# Patient Record
Sex: Male | Born: 1995 | Race: Black or African American | Hispanic: No | Marital: Single
Health system: Midwestern US, Community
[De-identification: ages and names within clinical notes are randomized; demographics above are authoritative.]

## PROBLEM LIST (undated history)

## (undated) DIAGNOSIS — Z9889 Other specified postprocedural states: Secondary | ICD-10-CM

## (undated) DIAGNOSIS — M7650 Patellar tendinitis, unspecified knee: Secondary | ICD-10-CM

## (undated) DIAGNOSIS — M7652 Patellar tendinitis, left knee: Secondary | ICD-10-CM

---
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
## 2015-07-17 IMAGING — CR DG CHEST 2V
2 series · 2 of 2 positions shown · non-contrast
Comparison: May 21, 2012

CLINICAL DATA: Pain

EXAM:
CHEST  2 VIEW

[w chest pa]
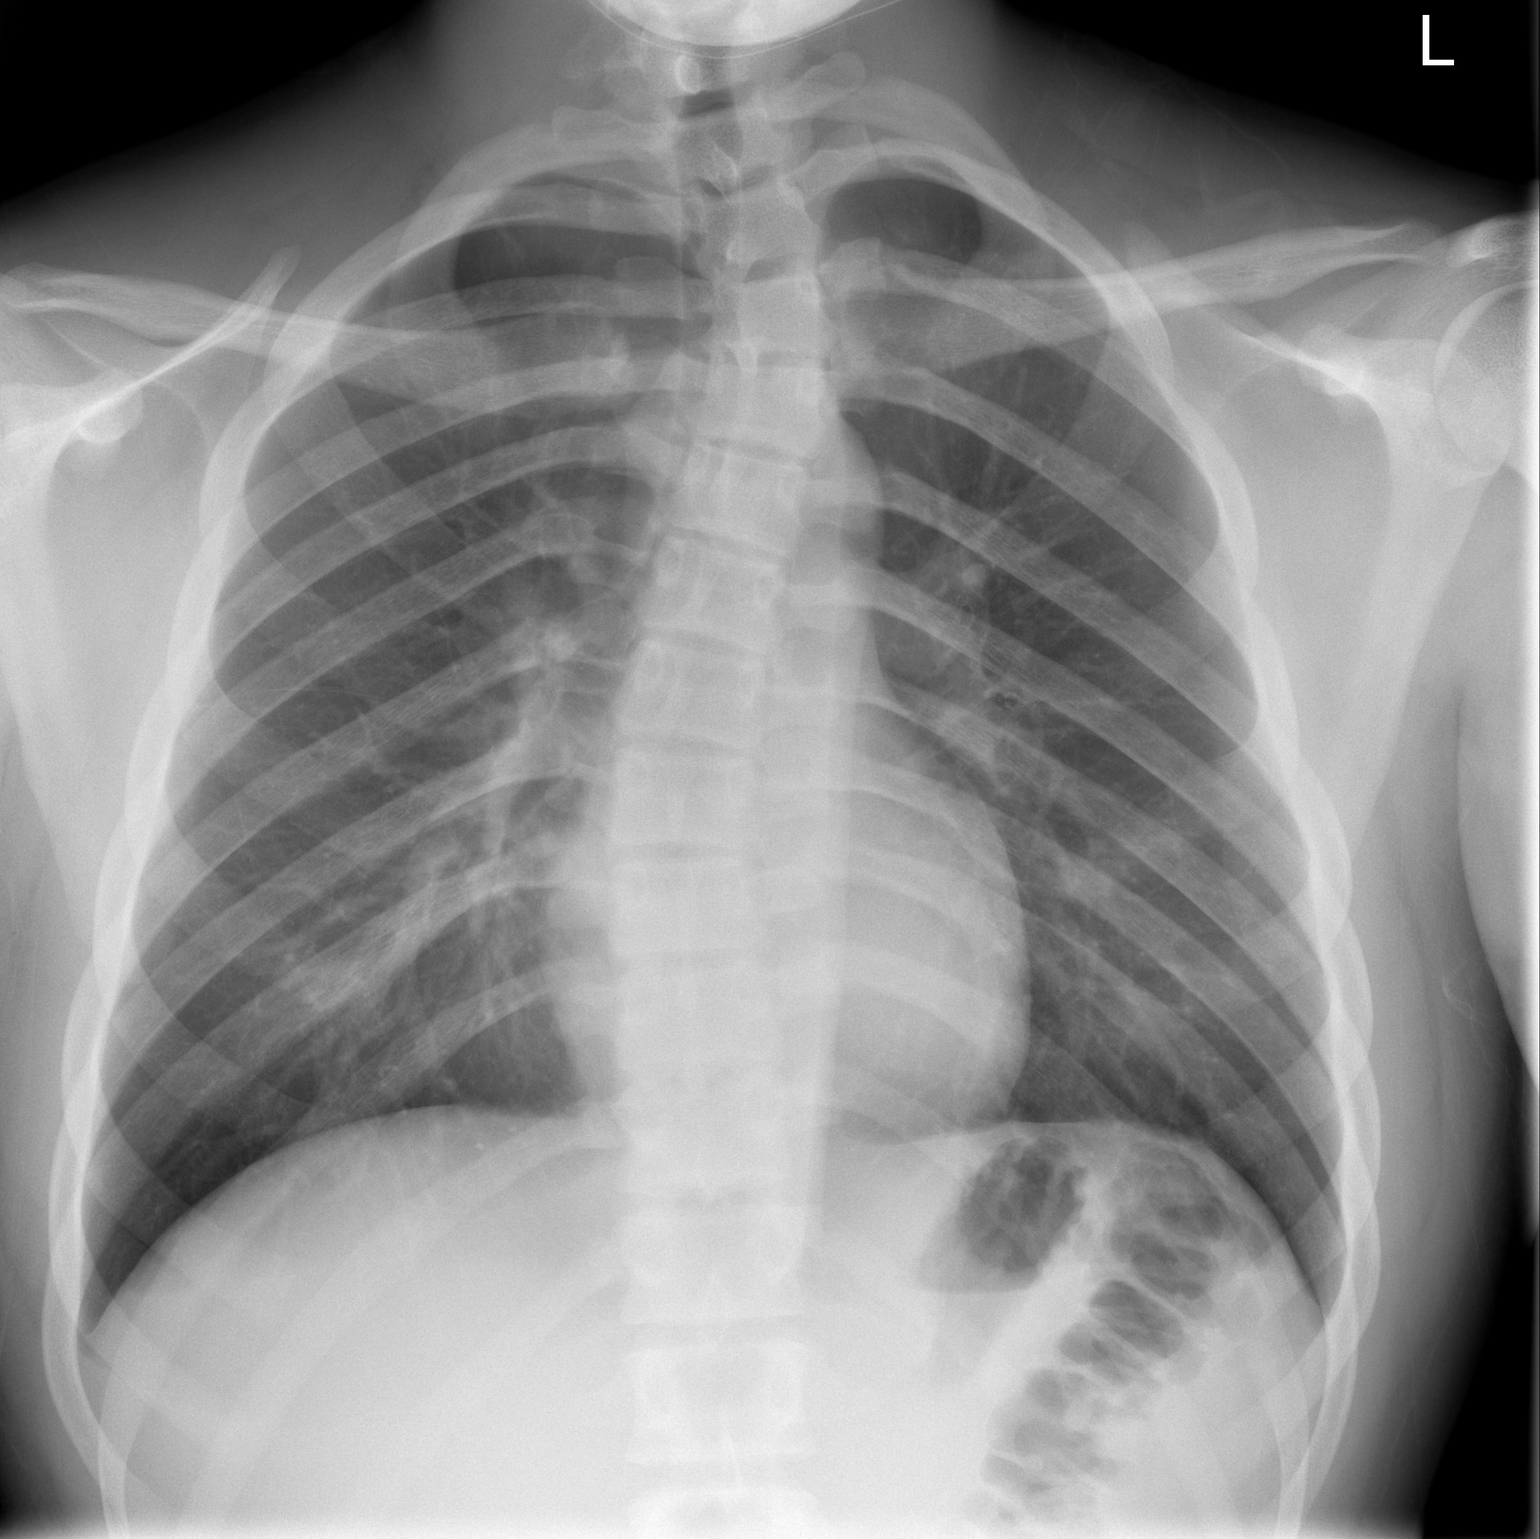

[w chest lat]
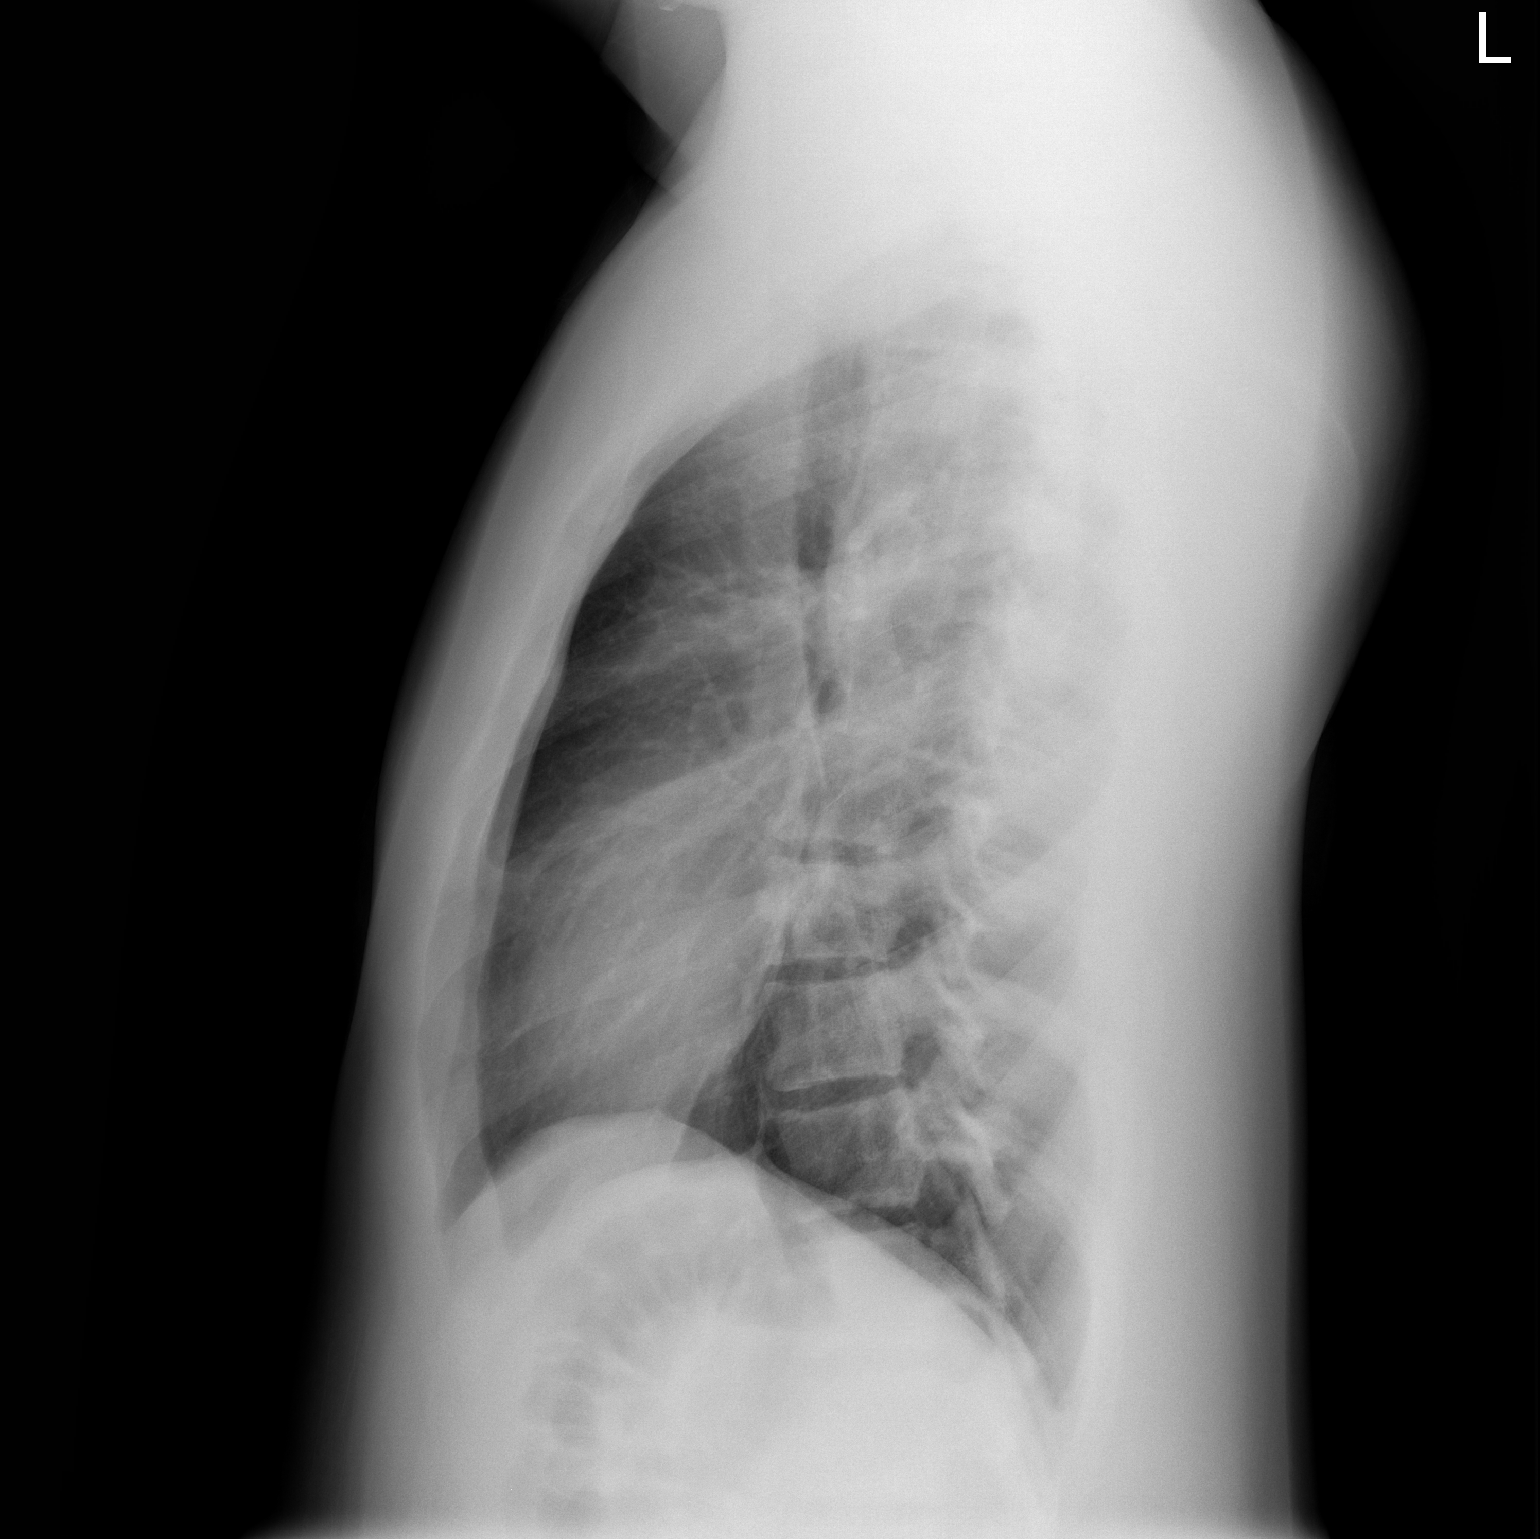

[2 of 2 positions shown; findings below may reference images not displayed]

FINDINGS: Lungs are clear. The heart size and pulmonary vascularity are
normal. No adenopathy. No pneumothorax. There is upper thoracic
levoscoliosis with mid thoracic dextroscoliosis. There is also
thoracic lordosis.
IMPRESSION: Scoliosis and thoracic lordosis.  Lungs clear.

## 2016-01-31 DIAGNOSIS — J069 Acute upper respiratory infection, unspecified: Secondary | ICD-10-CM

## 2016-01-31 NOTE — ED Provider Notes (Signed)
EMERGENCY DEPARTMENT HISTORY AND PHYSICAL EXAM    Date: 01/31/2016  Patient Name: Shawn Greer    History of Presenting Illness     Chief Complaint   Patient presents with   ??? Sinus Pain         History Provided By: Patient    Chief Complaint: sinus pain  Duration: 1 Months  Timing:  Waxing and Waning  Location: head  Associated Symptoms: nose bleed, head congestion, rhinorrhea, photophobia, HA, sore throat, cough    Additional History (Context):   11:48 PM    Shawn MontgomeryMichael L Fonseca is a 20 y.o. male who presents to the emergency department C/O sinus pain for 1 month. Associated sxs include right sided nose bleeds x4 today, head congestion, rhinorrhea, photophobia, HA waxing and waning, sore throat, and productive cough with green mucous. Pt has been using Nyquil. Today Pt was given Sudafed. Pt denies fever, tobacco use and any other sxs or complaints.     PCP: No primary care provider on file.        Past History     Past Medical History:  History reviewed. No pertinent past medical history.    Past Surgical History:  History reviewed. No pertinent surgical history.    Family History:  History reviewed. No pertinent family history.    Social History:  Social History   Substance Use Topics   ??? Smoking status: None   ??? Smokeless tobacco: None   ??? Alcohol use None       Allergies:  Allergies   Allergen Reactions   ??? Pcn [Penicillins] Hives         Review of Systems   Review of Systems   Constitutional: Negative for fever.   HENT: Positive for congestion, nosebleeds, rhinorrhea, sinus pain and sore throat.    Eyes: Positive for photophobia.   Respiratory: Positive for cough.    Neurological: Positive for headaches.   All other systems reviewed and are negative.      Physical Exam     Vitals:    01/31/16 2337   BP: 167/84   Pulse: 70   Resp: 16   Temp: 97.9 ??F (36.6 ??C)   SpO2: 100%   Weight: 124.7 kg (275 lb)   Height: 6\' 9"  (2.057 m)     Physical Exam    Constitutional: He is oriented to person, place, and time. He appears well-developed and well-nourished. No distress.   HENT:   Head: Normocephalic and atraumatic.   Right Ear: External ear normal. Tympanic membrane is not injected, not erythematous and not bulging. A middle ear effusion is present.   Left Ear: External ear normal. Tympanic membrane is not injected, not erythematous and not bulging. A middle ear effusion is present.   Nose: Right sinus exhibits no maxillary sinus tenderness and no frontal sinus tenderness. Left sinus exhibits no maxillary sinus tenderness and no frontal sinus tenderness.   Mouth/Throat: Uvula is midline, oropharynx is clear and moist and mucous membranes are normal. No oral lesions. No trismus in the jaw. No uvula swelling. No oropharyngeal exudate, posterior oropharyngeal edema, posterior oropharyngeal erythema or tonsillar abscesses.   Audible nasal congestion    Eyes: Conjunctivae and EOM are normal. Pupils are equal, round, and reactive to light.   Neck: Normal range of motion. Neck supple.   Cardiovascular: Normal rate and regular rhythm.    Pulmonary/Chest: Effort normal and breath sounds normal.   Musculoskeletal: Normal range of motion.   Neurological: He is alert  and oriented to person, place, and time.   Skin: Skin is warm and dry.   Psychiatric: He has a normal mood and affect. His behavior is normal.   Nursing note and vitals reviewed.        Diagnostic Study Results     Labs -   No results found for this or any previous visit (from the past 12 hour(s)).    Radiologic Studies -   No orders to display     CT Results  (Last 48 hours)    None        CXR Results  (Last 48 hours)    None          Medications given in the ED-  Medications - No data to display      Medical Decision Making   I am the first provider for this patient.    I reviewed the vital signs, available nursing notes, past medical history, past surgical history, family history and social history.     Vital Signs-Reviewed the patient's vital signs.    Pulse Oximetry Analysis - 100% on RA     Records Reviewed: Nursing Notes    Procedures:  Procedures    ED Course:   11:48 PM Initial assessment performed. The patients presenting problems have been discussed, and they are in agreement with the care plan formulated and outlined with them.  I have encouraged them to ask questions as they arise throughout their visit.    Diagnosis and Disposition       DISCHARGE NOTE:  11:56 PM  Marolyn Hammock Pew's  results have been reviewed with him.  He has been counseled regarding his diagnosis, treatment, and plan.  He verbally conveys understanding and agreement of the signs, symptoms, diagnosis, treatment and prognosis and additionally agrees to follow up as discussed.  He also agrees with the care-plan and conveys that all of his questions have been answered.  I have also provided discharge instructions for him that include: educational information regarding their diagnosis and treatment, and list of reasons why they would want to return to the ED prior to their follow-up appointment, should his condition change. He has been provided with education for proper emergency department utilization.     CLINICAL IMPRESSION:    1. Acute URI        PLAN:  1. D/C Home  2.   Current Discharge Medication List      START taking these medications    Details   butalbital-acetaminophen-caff (FIORICET) 50-300-40 mg per capsule Take 1 Cap by mouth every four (4) hours as needed for Pain. Max Daily Amount: 6 Caps.  Qty: 20 Cap, Refills: 0      loratadine-pseudoephedrine (CLARITIN-D 24 HOUR) 10-240 mg per tablet Take 1 Tab by mouth daily.  Qty: 12 Tab, Refills: 0      fluticasone (FLONASE) 50 mcg/actuation nasal spray 2 Sprays by Both Nostrils route daily.  Qty: 1 Bottle, Refills: 0           3.   Follow-up Information     Follow up With Details Comments Contact Info    [facility] Schedule an appointment as soon as possible for a visit For  primary care follow up [address] [place] [address]  [place] [address]  [phone]    [facility] EMERGENCY DEPT Go to As needed, If symptoms worsen [address]  [place] [address]  [phone]  _______________________________    Attestations:  This note is prepared by Sherrilee Gillesebecca A Hill, acting as Neurosurgeoncribe for [employer] FinancialBrannon Kacy Hegna, PA-C.    Lanecia Sliva, PA-C:  The scribe's documentation has been prepared under my direction and personally reviewed by me in its entirety.  I confirm that the note above accurately reflects all work, treatment, procedures, and medical decision making performed by me.  _______________________________

## 2016-01-31 NOTE — ED Triage Notes (Signed)
Sinus pain and pressure with cough and intermittent nose bleeds for a month.

## 2016-02-01 ENCOUNTER — Inpatient Hospital Stay
Admit: 2016-02-01 | Discharge: 2016-02-01 | Disposition: A | Discharge status: Home / Self Care | Payer: Self-pay | Attending: Internal Medicine

## 2016-02-01 MED ORDER — FLUTICASONE 50 MCG/ACTUATION NASAL SPRAY, SUSP
50 mcg/actuation | Freq: Every day | NASAL | 0 refills | Status: AC
Start: 2016-02-01 — End: ?

## 2016-02-01 MED ORDER — BUTALBITAL-ACETAMINOPHEN-CAFFEINE 50 MG-300 MG-40 MG CAPSULE
50-300-40 mg | ORAL_CAPSULE | ORAL | 0 refills | Status: AC | PRN
Start: 2016-02-01 — End: ?

## 2016-02-01 MED ORDER — LORATADINE-PSEUDOEPHEDRINE SR 10 MG-240 MG 24 HR TAB
10-240 mg | ORAL_TABLET | Freq: Every day | ORAL | 0 refills | Status: AC
Start: 2016-02-01 — End: ?

## 2021-08-31 ENCOUNTER — Inpatient Hospital Stay: Admit: 2021-08-31 | Payer: PRIVATE HEALTH INSURANCE

## 2021-08-31 ENCOUNTER — Encounter

## 2021-08-31 DIAGNOSIS — M7652 Patellar tendinitis, left knee: Secondary | ICD-10-CM

## 2021-08-31 LAB — CBC
Hematocrit: 45.1 % (ref 36.0–48.0)
Hemoglobin: 15.1 g/dL (ref 13.0–16.0)
MCH: 29.2 PG (ref 24.0–34.0)
MCHC: 33.5 g/dL (ref 31.0–37.0)
MCV: 87.1 FL (ref 78.0–100.0)
MPV: 9.2 FL (ref 9.2–11.8)
Nucleated RBCs: 0 PER 100 WBC
Platelets: 341 10*3/uL (ref 135–420)
RBC: 5.18 M/uL (ref 4.35–5.65)
RDW: 13.7 % (ref 11.6–14.5)
WBC: 8.4 10*3/uL (ref 4.6–13.2)
nRBC: 0 10*3/uL (ref 0.00–0.01)

## 2021-08-31 LAB — URINALYSIS
Bilirubin Urine: NEGATIVE
Blood, Urine: NEGATIVE
Glucose, UA: NEGATIVE mg/dL
Leukocyte Esterase, Urine: NEGATIVE
Nitrite, Urine: NEGATIVE
Protein, UA: NEGATIVE mg/dL
Specific Gravity, UA: 1.029 (ref 1.005–1.030)
Urobilinogen, Urine: 1 EU/dL (ref 0.2–1.0)
pH, Urine: 6 (ref 5.0–8.0)

## 2021-08-31 LAB — COMPREHENSIVE METABOLIC PANEL
ALT: 93 U/L — ABNORMAL HIGH (ref 16–61)
AST: 25 U/L (ref 10–38)
Albumin/Globulin Ratio: 1.2 (ref 0.8–1.7)
Albumin: 4.2 g/dL (ref 3.4–5.0)
Alk Phosphatase: 119 U/L — ABNORMAL HIGH (ref 45–117)
Anion Gap: 4 mmol/L (ref 3.0–18)
BUN: 19 MG/DL — ABNORMAL HIGH (ref 7.0–18)
Bun/Cre Ratio: 15 (ref 12–20)
CO2: 28 mmol/L (ref 21–32)
Calcium: 9.6 MG/DL (ref 8.5–10.1)
Chloride: 105 mmol/L (ref 100–111)
Creatinine: 1.25 MG/DL (ref 0.6–1.3)
Est, Glom Filt Rate: >60 mL/min/{1.73_m2} (ref >60)
Globulin: 3.5 g/dL (ref 2.0–4.0)
Glucose: 109 mg/dL — ABNORMAL HIGH (ref 74–99)
Potassium: 4.8 mmol/L (ref 3.5–5.5)
Sodium: 137 mmol/L (ref 136–145)
Total Bilirubin: 0.7 MG/DL (ref 0.2–1.0)
Total Protein: 7.7 g/dL (ref 6.4–8.2)

## 2021-09-01 LAB — EKG 12-LEAD
Atrial Rate: 91 {beats}/min
Diagnosis: NORMAL
P Axis: 31 degrees
P-R Interval: 136 ms
Q-T Interval: 342 ms
QRS Duration: 84 ms
QTc Calculation (Bazett): 420 ms
R Axis: 69 degrees
T Axis: 23 degrees
Ventricular Rate: 91 {beats}/min

## 2021-09-01 LAB — CULTURE, URINE: Culture: NO GROWTH

## 2021-09-02 ENCOUNTER — Inpatient Hospital Stay: Payer: PRIVATE HEALTH INSURANCE

## 2021-09-02 NOTE — Other (Signed)
PAT - SURGICAL PRE-ADMISSION INSTRUCTIONS    NAME:  Shawn Greer                                                          TODAY'S DATE:  09/02/2021    SURGERY DATE: 09/14/2021                               SURGERY ARRIVAL TIME:   tbd    Do NOT eat or drink anything, including candy or gum, after MIDNIGHT on 09/14/2021 , unless you have specific instructions from your Surgeon or Anesthesia Provider to do so.  No smoking 24 hours before surgery.  No alcohol 24 hours prior to the day of surgery.  No recreational drugs for one week prior to the day of surgery.  Leave all valuables, including money/purse, at home.  Remove all jewelry, nail polish, makeup (including mascara); no lotions, powders, deodorant, or perfume/cologne/after shave.  Glasses/Contact lenses and Dentures may be worn to the hospital.  They will be removed prior to surgery.  Call your doctor if symptoms of a cold or illness develop within 24 ours prior to surgery.  AN ADULT MUST DRIVE YOU HOME AFTER OUTPATIENT SURGERY.   If you are having an OUTPATIENT procedure, please make arrangements for a responsible adult to be with you for 24 hours after your surgery.  If you are admitted to the hospital, you will be assigned to a bed after surgery is complete.  Normally a family member will not be able to see you until you are in your assigned bed.  12. Visitation Restrictions Explained.    Special Instructions:    Bring ID and insurance card DOS  Stop supplements now prior to surgery  Follow all written and verbal instructions from your surgeon's office.  Patient Prep:    use CHG solution, Instructions provided to start washes 3 days prior to surgery.     These surgical instructions were reviewed with patient during the PAT phone (visit/phone call).  Pt verbalized understanding of instructions provided.

## 2021-09-12 NOTE — H&P (Signed)
Patient Name:  Shawn Greer, Shawn Greer    Date of Birth:  [birth year]      Chief Complaint:  Left knee pain.    History of Chief Complaint:  Shawn Greer comes in for evaluation of focal symptoms in the anterior left knee with limited motion and weightbearing pain.  He has pain with activity.  He is currently in an internship working with the [employer]  He had a pop and pain in the anterior portion of the knee about a week ago with continued limitations prompting today's evaluation.  MRI was done and points to a patellar tendon rupture prompting today's evaluation with the knee in extension.    Past Medical/Surgical History:  Patient reported no relevant past med/surg/psych history.     Allergies:    Ingredient Reaction Medication Name Comment   PENICILLINS Hives         Current Medications:  None.     Social History:    SMOKING  Status Tobacco Type Units Per Day Yrs Used   Never smoker        ALCOHOL  There is a history of alcohol use.     Family History:    Disease Detail Family Member Age Cause of Death Comments   Hypertension Other      Stroke Other      Diabetes mellitus Other      Rheumatoid arthritis Mother      Heart disease Other        Review of Systems:    GENERAL:  Patient has no signs of chills or weight change., fever or fatigue  HEAD/ENTM:  Patient has no signs of headaches, dizziness, hearing loss, sore throat, recent cold, double vision, blurred vision, itchy eyes, eye redness or eye discharge. or ringing in ears  NEUROLOGIC: Patient presents with numbness/tingling. Patient has no signs of muscle weakness, loss of balance or seizure disorder. or fainting  CARDIOVASCULAR:  Patient has no signs of chest pain, palpitations, rheumatic fever or heart murmur.  RESPIRATORY:  Patient has no signs of chronic cough, wheezing, pain on breathing or shortness of breath. or difficulty breathing  GASTROINTESTINAL:  Patient has no signs of nausea/vomiting, difficulty swallowing, gas/bloating,  indigestion, abdominal pain, diarrhea, bloody stools or hemorrhoids.  GENITOURINARY:  Patient has no signs of blood in the urine, burning sensation, frequent urinating or incontinence., painful urinating or bladder/kidney infection  MUSCULOSKELETAL: Patient presents with tendonitis, joint stiffness and joint pain. Patient has no signs of fracture/dislocation, sprain/strain, rheumatoid disease, gout or swelling in feet.  INTEGUMENTARY:  Patient has no signs of rash/itching, psoriasis, Raynaud's phenomenon or varicose veins.  EMOTIONAL:  Patient has no signs of bipolar disorder or change in mood., anxiety, depression or memory loss    Vitals:  Date BP Pulse Temp (F) Resp. (per min.) Height (Total in.) Weight (lbs.) BMI   08/30/2021     80.00 325.00 35.70   08/30/2021     80.00 325.00 35.70     Physical Examination:    Psychiatric/General:  No acute distress; pleasant and accommodating.  HEENT:  Moist mucous membranes intact.  Lymphatic:  Neck is supple with no lymphadenopathy upon evaluation.  Respiratory:   Equal bilateral chest wall movement with inspiration; no wheezes or stridor audible.  Cardiovascular:    Regular rate and rhythm, as noted by upper extremity pulses.  Musculoskeletal: On evaluation of the left lower extremity, brace is in place.  Motor is intact.  He has difficulty with knee extension and is utilization  of the brace makes it feel more stable.     MRI shows complete disruption of the inferior pole of the patella consistent with patellar tendon rupture.      Assessment: Left patellar tendon rupture. This injury is concerning especially in someone his size.  He is 6 foot 8, and his height is a contributing factor for this.  Failure of repair is a huge concern with these injuries.    Recommendation:  Patient was given an order for a hinged knee brace to be taken to another facility.  The patient is ambulatory but has weakness or deformity of their left knee which requires stabilization from this  semi-rigid/rigid orthosis to improve their function.    We reviewed findings and recommendations for moving forward. Surgical intervention is his best option with direct tendon repair.  He will continue efforts to maintain stability with a hinged knee brace to maintain zero which we will be able to transition to motion as he progresses with activities. We will continue plans for scheduling for patellar tendon repair.          Aleene Davidson, DO

## 2021-09-13 NOTE — Discharge Instructions (Addendum)
[facility]  Dr. Hurley Cisco Post-Operative Instructions Patella Tendon Repair    Diet:  1. Begin with liquids and light foods such as Jell-O and soups.  2. Advance as tolerated to your regular diet if not nauseated.    First 24 hours:  1. Be in the care of a responsible adult.  2. Do not drive or operate machinery.  3. Do not drink alcoholic beverages.    Activities:  1. Elevate the limb above hip and preferably above chest for 48 hours.  2. Ice should be applied to the knee in a waterproof bag for 15-30 minutes each hour while awake for first 48 hours.  3. You may walk with knee immobilizer on operative leg.  4. Do not engage in activities that increase your pain such as stair-climbing or prolonged standing.  5. Return to work depends on your type of employment.  6. Follow-up with Dr. Thomos Lemons in 7-10 days post-operatively.    Exercise:  1. Begin exercises the day of surgery for both legs and repeat hourly while awake:  *Quad sets (tightening the thigh muscles)  *?Straight leg raises (lift and hold 12-18" off bed or floor for 8 count)  *?Vigorous ankle pumps (toes towards and away from head)      Wound Care:  1. Maintain your postoperative dressing. Loosen the ACE wrap if swelling of the foot or ankle occurs.  2. You may remove your ACE wrap and gauze on the second post op day. Keep steri strips or bandage in place until first postop visit. You can re-wrap the ACE bandage until swelling of knee is gone. To maintain good circulation, do not wrap too tightly.  3. Keep the surgical incisions dry until you see your doctor. Use a plastic bag with rubber bands to cover the limb during showers. Immersing the limb in water is to be avoided.    Medications:  1. Strong oral narcotic pain medications have been prescribed for the first few days. Use only as directed. No pain medication is capable of taking away all the pain. Taking your pills at regular intervals will give you the best chance of having less pain.  2. If you need a refill  PLEASE PLAN AHEAD. Call our office during regular hours (8-5).  3. Do not combine with alcoholic beverages.  4. Be careful as you walk, climb stairs or drive as mild dizziness is not unusual.  5. Do not take medications that have not been prescribed by your surgeon.  6. You may switch to over the counter pain medication of your choice as you become more comfortable.    WHEN TO CALL YOUR SURGEON:  1. Significant swelling or any new numbness in the limb that was operated on  2. Unrelenting pain  3. Fever or Chills  4. Redness around incisions  5. Color change in foot or toes  6. Continuous drainage or bleeding from wounds (a small amount of drainage is expected)  7. Any other worrisome condition    WHEN TO CALL YOUR REGULAR DOCTOR:  1. Flare up of any of your regular medical conditions    WHEN TO CALL 911:  1. Chest Pain  2. Shortness of Breath  3. Any other acute serious condition    CALL THE OFFICE:  If you have severe pain unrelieved by the medications;  If you have a fever of 101.33F or greater;   If you notice excessive swelling, redness, or persistent drainage from the incision or IV site;  [facility] office number is [phone] from 8:00am to 5:00pm Monday through Friday. After 5:00pm, on weekends, or holidays, please leave a message with our answering service and the doctor on-call will get back to you shortly.    Marland Kitchen   DISCHARGE SUMMARY from Nurse    PATIENT INSTRUCTIONS:    After general anesthesia or intravenous sedation, for 24 hours or while taking prescription Narcotics:  Limit your activities  Do not drive and operate hazardous machinery  Do not make important personal or business decisions  Do  not drink alcoholic beverages  If you have not urinated within 8 hours after discharge, please contact your surgeon on call.    Report the following to your surgeon:  Excessive pain, swelling, redness or odor of or around the surgical area  Temperature over 100.5  Nausea and vomiting  lasting longer than 4 hours or if unable to take medications  Any signs of decreased circulation or nerve impairment to extremity: change in color, persistent  numbness, tingling, coldness or increase pain  Any questions    What to do at Home:  Recommended activity: , follow surgeons instructions     If you experience any of the following symptoms, fever, chills, foul drainage or uncontrolled pain please follow up with Dr. Thomos Lemons.    *  Please give a list of your current medications to your Primary Care Provider.    *  Please update this list whenever your medications are discontinued, doses are      changed, or new medications (including over-the-counter products) are added.    *  Please carry medication information at all times in case of emergency situations.    These are general instructions for a healthy lifestyle:    No smoking/ No tobacco products/ Avoid exposure to second hand smoke  Surgeon General's Warning:  Quitting smoking now greatly reduces serious risk to your health.    Obesity, smoking, and sedentary lifestyle greatly increases your risk for illness    A healthy diet, regular physical exercise & weight monitoring are important for maintaining a healthy lifestyle    You may be retaining fluid if you have a history of heart failure or if you experience any of the following symptoms:  Weight gain of 3 pounds or more overnight or 5 pounds in a week, increased swelling in our hands or feet or shortness of breath while lying flat in bed.  Please call your doctor as soon as you notice any of these symptoms; do not wait until your next office visit.    Patient armband removed and shredded     The discharge information has been reviewed with the patient and caregiver.  The patient and caregiver verbalized understanding.  Discharge medications reviewed with the patient and caregiver and appropriate educational materials and side effects teaching were  provided.  ___________________________________________________________________________________________________________________________________

## 2021-09-14 ENCOUNTER — Ambulatory Visit: Admit: 2021-09-14 | Payer: PRIVATE HEALTH INSURANCE

## 2021-09-14 ENCOUNTER — Inpatient Hospital Stay: Payer: 59 | Attending: Orthopaedic Surgery

## 2021-09-14 MED ORDER — ROPIVACAINE HCL 5 MG/ML IJ SOLN
INTRAMUSCULAR | Status: AC
Start: 2021-09-14 — End: 2021-09-14
  Administered 2021-09-14: 15:00:00 30 via PERINEURAL

## 2021-09-14 MED ORDER — FENTANYL CITRATE (PF) 100 MCG/2ML IJ SOLN
100 MCG/2ML | INTRAMUSCULAR | Status: AC
Start: 2021-09-14 — End: ?

## 2021-09-14 MED ORDER — DEXAMETHASONE SODIUM PHOSPHATE 4 MG/ML IJ SOLN
4 MG/ML | INTRAMUSCULAR | Status: DC | PRN
Start: 2021-09-14 — End: 2021-09-14
  Administered 2021-09-14: 17:00:00 8 via INTRAVENOUS

## 2021-09-14 MED ORDER — IPRATROPIUM-ALBUTEROL 0.5-2.5 (3) MG/3ML IN SOLN
Freq: Once | RESPIRATORY_TRACT | Status: DC | PRN
Start: 2021-09-14 — End: 2021-09-14

## 2021-09-14 MED ORDER — FENTANYL CITRATE (PF) 100 MCG/2ML IJ SOLN
100 MCG/2ML | INTRAMUSCULAR | Status: DC
Start: 2021-09-14 — End: 2021-09-14

## 2021-09-14 MED ORDER — LACTATED RINGERS IV SOLN
INTRAVENOUS | Status: DC
Start: 2021-09-14 — End: 2021-09-14
  Administered 2021-09-14: 19:00:00 125 via INTRAVENOUS
  Administered 2021-09-14 (×2): via INTRAVENOUS

## 2021-09-14 MED ORDER — DEXMEDETOMIDINE HCL 200 MCG/2ML IV SOLN
200 MCG/2ML | INTRAVENOUS | Status: AC
Start: 2021-09-14 — End: 2021-09-14
  Administered 2021-09-14: 15:00:00 .2 via PERINEURAL

## 2021-09-14 MED ORDER — MIDAZOLAM HCL 2 MG/2ML IJ SOLN
2 MG/ML | INTRAMUSCULAR | Status: AC
Start: 2021-09-14 — End: ?

## 2021-09-14 MED ORDER — GLYCOPYRROLATE 0.4 MG/2ML IJ SOLN
0.4 MG/2ML | INTRAMUSCULAR | Status: AC
Start: 2021-09-14 — End: ?

## 2021-09-14 MED ORDER — BUPIVACAINE-EPINEPHRINE 0.5% -1:200000 IJ SOLN (MIXTURES ONLY)
INTRAMUSCULAR | Status: DC | PRN
Start: 2021-09-14 — End: 2021-09-14
  Administered 2021-09-14: 18:00:00 100 via INTRAMUSCULAR

## 2021-09-14 MED ORDER — METOCLOPRAMIDE HCL 5 MG/ML IJ SOLN
5 MG/ML | Freq: Once | INTRAMUSCULAR | Status: DC | PRN
Start: 2021-09-14 — End: 2021-09-14

## 2021-09-14 MED ORDER — PROPOFOL 200 MG/20ML IV EMUL
200 MG/20ML | INTRAVENOUS | Status: DC | PRN
Start: 2021-09-14 — End: 2021-09-14
  Administered 2021-09-14 (×2): 20 via INTRAVENOUS
  Administered 2021-09-14: 17:00:00 300 via INTRAVENOUS

## 2021-09-14 MED ORDER — BUPIVACAINE LIPOSOME 1.3 % IJ SUSP
1.3 % | INTRAMUSCULAR | Status: AC
Start: 2021-09-14 — End: ?

## 2021-09-14 MED ORDER — GLYCOPYRROLATE 0.4 MG/2ML IJ SOLN
0.4 MG/2ML | INTRAMUSCULAR | Status: DC | PRN
Start: 2021-09-14 — End: 2021-09-14
  Administered 2021-09-14: 17:00:00 .2 via INTRAVENOUS

## 2021-09-14 MED ORDER — LIDOCAINE HCL (PF) 2 % IJ SOLN
2 % | INTRAMUSCULAR | Status: DC | PRN
Start: 2021-09-14 — End: 2021-09-14
  Administered 2021-09-14: 17:00:00 100 via INTRAVENOUS

## 2021-09-14 MED ORDER — HYDROMORPHONE HCL PF 1 MG/ML IJ SOLN
1 MG/ML | INTRAMUSCULAR | Status: DC | PRN
Start: 2021-09-14 — End: 2021-09-14

## 2021-09-14 MED ORDER — MIDAZOLAM HCL 2 MG/2ML IJ SOLN
2 MG/ML | INTRAMUSCULAR | Status: DC | PRN
Start: 2021-09-14 — End: 2021-09-14
  Administered 2021-09-14 (×2): 2 via INTRAVENOUS

## 2021-09-14 MED ORDER — MELOXICAM 15 MG PO TABS
15 MG | ORAL_TABLET | Freq: Every day | ORAL | 1 refills | Status: AC
Start: 2021-09-14 — End: 2021-11-13

## 2021-09-14 MED ORDER — PROPOFOL 200 MG/20ML IV EMUL
200 MG/20ML | INTRAVENOUS | Status: AC
Start: 2021-09-14 — End: ?

## 2021-09-14 MED ORDER — NORMAL SALINE FLUSH 0.9 % IV SOLN
0.9 % | INTRAVENOUS | Status: DC | PRN
Start: 2021-09-14 — End: 2021-09-14

## 2021-09-14 MED ORDER — DEXAMETHASONE SOD PHOSPHATE PF 10 MG/ML IJ SOLN
10 MG/ML | INTRAMUSCULAR | Status: AC
Start: 2021-09-14 — End: 2021-09-14
  Administered 2021-09-14: 15:00:00 4

## 2021-09-14 MED ORDER — EPHEDRINE SULFATE-NACL 50-0.9 MG/5ML-% IV SOSY
INTRAVENOUS | Status: DC | PRN
Start: 2021-09-14 — End: 2021-09-14
  Administered 2021-09-14 (×2): 10 via INTRAVENOUS

## 2021-09-14 MED ORDER — DEXAMETHASONE SODIUM PHOSPHATE 4 MG/ML IJ SOLN
4 MG/ML | INTRAMUSCULAR | Status: AC
Start: 2021-09-14 — End: ?

## 2021-09-14 MED ORDER — POVIDONE-IODINE 5 % OP SOLN
5 % | OPHTHALMIC | Status: DC | PRN
Start: 2021-09-14 — End: 2021-09-14
  Administered 2021-09-14: 17:00:00 30 via TOPICAL

## 2021-09-14 MED ORDER — CEPHALEXIN 500 MG PO CAPS
500 MG | ORAL_CAPSULE | Freq: Three times a day (TID) | ORAL | 0 refills | Status: AC
Start: 2021-09-14 — End: 2021-09-15

## 2021-09-14 MED ORDER — SODIUM CHLORIDE 0.9 % IR SOLN
0.9 % | Status: AC | PRN
Start: 2021-09-14 — End: 2021-09-14
  Administered 2021-09-14: 17:00:00 500

## 2021-09-14 MED ORDER — CEFAZOLIN 3000 MG IN 30 ML SWFI IV SYRINGE (PREMIX)
Freq: Three times a day (TID) | Status: DC
Start: 2021-09-14 — End: 2021-09-14
  Administered 2021-09-14: 17:00:00 3000 mg via INTRAVENOUS

## 2021-09-14 MED ORDER — FENTANYL CITRATE (PF) 100 MCG/2ML IJ SOLN
100 MCG/2ML | INTRAMUSCULAR | Status: DC | PRN
Start: 2021-09-14 — End: 2021-09-14

## 2021-09-14 MED ORDER — OXYCODONE HCL 5 MG PO TABS
5 MG | ORAL | Status: DC | PRN
Start: 2021-09-14 — End: 2021-09-14
  Administered 2021-09-14: 20:00:00 5 mg via ORAL

## 2021-09-14 MED ORDER — NORMAL SALINE FLUSH 0.9 % IV SOLN
0.9 % | Freq: Two times a day (BID) | INTRAVENOUS | Status: DC
Start: 2021-09-14 — End: 2021-09-14

## 2021-09-14 MED ORDER — SODIUM CHLORIDE 0.9 % IV SOLN
0.9 % | INTRAVENOUS | Status: DC | PRN
Start: 2021-09-14 — End: 2021-09-14

## 2021-09-14 MED ORDER — POVIDONE-IODINE 5 % OP SOLN
5 % | OPHTHALMIC | Status: AC
Start: 2021-09-14 — End: ?

## 2021-09-14 MED ORDER — LIDOCAINE HCL (PF) 2 % IJ SOLN
2 % | INTRAMUSCULAR | Status: AC
Start: 2021-09-14 — End: ?

## 2021-09-14 MED ORDER — HYDROCODONE-ACETAMINOPHEN 5-325 MG PO TABS
5-325 MG | ORAL_TABLET | Freq: Three times a day (TID) | ORAL | 0 refills | Status: AC | PRN
Start: 2021-09-14 — End: 2021-09-21

## 2021-09-14 MED ORDER — EPHEDRINE SULFATE-NACL 50-0.9 MG/5ML-% IV SOSY
INTRAVENOUS | Status: AC
Start: 2021-09-14 — End: ?

## 2021-09-14 MED ORDER — MIDAZOLAM HCL 2 MG/2ML IJ SOLN
2 MG/ML | INTRAMUSCULAR | Status: AC
Start: 2021-09-14 — End: 2021-09-14

## 2021-09-14 MED ORDER — FENTANYL CITRATE PF 50 MCG/ML IJ SOSY
50 MCG/ML | INTRAMUSCULAR | Status: DC | PRN
Start: 2021-09-14 — End: 2021-09-14
  Administered 2021-09-14 (×2): 100 via INTRAVENOUS
  Administered 2021-09-14 (×2): 50 via INTRAVENOUS

## 2021-09-14 MED ORDER — ASPIRIN 81 MG PO CHEW
81 MG | ORAL_TABLET | Freq: Two times a day (BID) | ORAL | 0 refills | Status: AC
Start: 2021-09-14 — End: 2021-10-05

## 2021-09-14 MED FILL — MIDAZOLAM HCL 2 MG/2ML IJ SOLN: 2 MG/ML | INTRAMUSCULAR | Qty: 2

## 2021-09-14 MED FILL — XYLOCAINE-MPF 2 % IJ SOLN: 2 % | INTRAMUSCULAR | Qty: 5

## 2021-09-14 MED FILL — EPHEDRINE SULFATE-NACL 50-0.9 MG/5ML-% IV SOSY: INTRAVENOUS | Qty: 5

## 2021-09-14 MED FILL — DIPRIVAN 200 MG/20ML IV EMUL: 200 MG/20ML | INTRAVENOUS | Qty: 40

## 2021-09-14 MED FILL — BETADINE OPHTHALMIC PREP 5 % OP SOLN: 5 % | OPHTHALMIC | Qty: 30

## 2021-09-14 MED FILL — LACTATED RINGERS IV SOLN: INTRAVENOUS | Qty: 1000

## 2021-09-14 MED FILL — FENTANYL CITRATE (PF) 100 MCG/2ML IJ SOLN: 100 MCG/2ML | INTRAMUSCULAR | Qty: 2

## 2021-09-14 MED FILL — BD POSIFLUSH 0.9 % IV SOLN: 0.9 % | INTRAVENOUS | Qty: 40

## 2021-09-14 MED FILL — DEXAMETHASONE SODIUM PHOSPHATE 4 MG/ML IJ SOLN: 4 MG/ML | INTRAMUSCULAR | Qty: 1

## 2021-09-14 MED FILL — OXYCODONE HCL 5 MG PO TABS: 5 MG | ORAL | Qty: 1

## 2021-09-14 MED FILL — GLYCOPYRROLATE 0.4 MG/2ML IJ SOLN: 0.4 MG/2ML | INTRAMUSCULAR | Qty: 2

## 2021-09-14 MED FILL — CEFAZOLIN 3000 MG IN 30 ML SWFI IV SYRINGE (PREMIX): Qty: 3000

## 2021-09-14 MED FILL — EXPAREL 1.3 % IJ SUSP: 1.3 % | INTRAMUSCULAR | Qty: 20

## 2021-09-14 NOTE — Op Note (Unsigned)
[facility]  OPERATIVE REPORT    Name:  Shawn Greer, Shawn Greer  MR#:   [id number]  DOB:  1995-10-29  ACCOUNT #:  [id number]  DATE OF SERVICE:  09/14/2021    PREOPERATIVE DIAGNOSIS:  Patellar tendon rupture.    POSTOPERATIVE DIAGNOSIS:  Patellar tendon rupture.    PROCEDURE PERFORMED:  Left knee patellar tendon direct repair.    SURGEON:  Diannia Ruder, DO    SURGICAL ASSISTANT:  Maceo Pro. Copley, PA-C    ANESTHESIA:  General anesthesia with femoral nerve blockade.    COMPLICATIONS:  None.    SPECIMENS REMOVED:  None.    IMPLANTS:  FiberWire #2 and #5.    ESTIMATED BLOOD LOSS:  *** mL.    INTRAVENOUS FLUIDS:  2000.    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  He had ruptured patellar tendon.  Came in for evaluation with complete disruption.  He had imaging that correlated with an MRI.  At that point, we managed to maintain his extension brace that he had in place with planned surgery for repair.  On date of evaluation, risks and benefits were reviewed.    PROCEDURE:  He underwent evaluation by Anesthesia.  He underwent femoral nerve blockade.  He was taken to the surgical suite under general anesthesia.  Extremity was prepped and draped in a routine fashion including a nonsterile tourniquet on the upper thigh.  Once completed with prep and drape, surgical time-out was done identifying the patient and procedure.  Incision on the anterior portion of the knee without insufflating the tourniquet was then made allowing to cauterize significant bleeders.  We maintained control without having to utilize the tourniquet to help better mobilize the tendon.  *** anterior portion of complete disruption in medial and lateral retinaculum in the inferior pole of the patella with mild retraction.  Debridement of this for mild scar formation was then done to free up the tendon.  Bony tunnels were utilized and drilled from inferior to superior portion of the patella.  Utilizing a Hewson suture  passer, I was able to place a stitch for a carry through on all three tunnels.  At that point, we used two #2 double-armed FiberWires running locked Krackow type portion with one lateral, one medial, and two central arms.  This was then pulled through those bony tunnels.  At that point, once passed through, I was able to tie down, maintain in extension or hyperextension for good repair.  This was stable with evaluation of flexion about 80 degrees, then oversewn with the medial retinaculum and the lateral retinaculum with #5 FiberWires as well as the mid substance of the tendon itself.  A suture was utilized within the central portion of the quad tendon to repair under dissection point for retrieval of the pass-through sutures.  At that point, we continued with copious irrigation.  Irrigation of the joint was done before closure of capsule and retinaculum as well followed by reapproximation of subcutaneous and skin with #1 Vicryl, 2-0 Vicryl and then an Exofin type dressing was placed on top *** then sterile dressings.  We maintained in an extension splint what he had, maintained strict limitations as far as extension, pain control, DVT prophylaxis with aspirin, and antibiotics for 24 hours orally.      Abundio Teuscher DAVID Avneet Ashmore, DO      [id number]  D:  09/14/2021 15:26  T:  09/14/2021 21:13  JOB #:  [id number]

## 2021-09-14 NOTE — Other (Signed)
Awake and alert pain is well controlled, dressing clean dry and intact immobilizer in place.  Palpable pulses to feet and able to dorsi/plantar flex bilat feet.

## 2021-09-14 NOTE — Anesthesia Post-Procedure Evaluation (Signed)
Department of Anesthesiology  Postprocedure Note    Patient: Shawn Greer  MRN: [id number]  Birthdate: [birth year]  Date of evaluation: 09/14/2021      Procedure Summary     Date: 09/14/21 Room / Location: [facility] MAIN 05 / [facility] MAIN OR    Anesthesia Start: 1257 Anesthesia Stop: 1515    Procedure: LEFT KNEE PATELLA TENDON REPAIR (Left: Knee) Diagnosis:       Patellar tendinitis of left knee      (Patellar tendinitis of left knee [M76.52])    Surgeons: Diannia Ruder, DO Responsible Provider: Berneta Levins, MD    Anesthesia Type: General, Regional ASA Status: 2          Anesthesia Type: General, Regional    Aldrete Phase I: Aldrete Score: 10    Aldrete Phase II: Aldrete Score: 10      Anesthesia Post Evaluation    Patient location during evaluation: PACU  Patient participation: complete - patient participated  Level of consciousness: awake and alert  Pain score: 3  Airway patency: patent  Nausea & Vomiting: no nausea and no vomiting  Complications: no  Cardiovascular status: blood pressure returned to baseline  Respiratory status: acceptable  Hydration status: euvolemic

## 2021-09-14 NOTE — Interval H&P Note (Signed)
Update History & Physical    The patient's History and Physical  was reviewed with the patient and I examined the patient. There was no change. The surgical site was confirmed by the patient and me.     Plan: The risks, benefits, expected outcome, and alternative to the recommended procedure have been discussed with the patient. Patient understands and wants to proceed with the procedure.     Electronically signed by Diannia Ruder, DO on 09/14/2021 at 11:43 AM

## 2021-09-14 NOTE — Brief Op Note (Signed)
Brief Postoperative Note      Patient: Shawn Greer  Date of Birth: 18-May-1995  MRN: [id number]    Date of Procedure: 09/14/2021    Post op diagnosis:  Complete tear of patella tendon left knee    Post-Op Diagnosis: Same       Procedure(s):  LEFT KNEE PATELLA TENDON REPAIR    Surgeon(s):  Diannia Ruder, DO    Assistant:  Physician Assistant: Billie Ruddy, PA-C    Anesthesia: General    Estimated Blood Loss (mL): 200     Complications: None    Specimens:   * No specimens in log *    Implants:  * No implants in log *      Drains: * No LDAs found *    Findings: complete patellar tendon tear      Electronically signed by Diannia Ruder, DO on 09/14/2021 at 3:22 PM

## 2021-09-14 NOTE — Other (Signed)
Family updated on patient status. Radiology aware.

## 2021-09-14 NOTE — Other (Signed)
Discharge instructions were reviewed with patient and family. All questions were answered.

## 2021-09-14 NOTE — Anesthesia Pre-Procedure Evaluation (Signed)
Department of Anesthesiology  Preprocedure Note       Name:  Shawn Greer   Age:  26 y.o.  DOB:  1995-08-24                                          MRN:  [id number]         Date:  09/14/2021      Surgeon: Moishe Spice):  Diannia Ruder, DO    Procedure: Procedure(s):  LEFT KNEE PATELLA TENDON REPAIR    Medications prior to admission:   Prior to Admission medications    Medication Sig Start Date End Date Taking? Authorizing Provider   Turmeric (QC TUMERIC COMPLEX PO) Take by mouth    Historical Provider, MD   ibuprofen (ADVIL;MOTRIN) 200 MG tablet Take 1 tablet by mouth every 6 hours as needed for Pain    Historical Provider, MD       Current medications:    Current Facility-Administered Medications   Medication Dose Route Frequency Provider Last Rate Last Admin   . lactated ringers IV soln infusion   IntraVENous Continuous Diannia Ruder, DO 125 mL/hr at 09/14/21 1035 NoRateChange at 09/14/21 1035       Allergies:    Allergies   Allergen Reactions   . Pcn [Penicillins] Hives     Pt states reaction during childhood        Problem List:  There is no problem list on file for this patient.      Past Medical History:        Diagnosis Date   . Advance directive discussed with patient 09/02/2021    denies having one   . Exercise tolerance finding 09/02/2021    pt states able to climb 2 flights of stairs without CP or SOB       Past Surgical History:        Procedure Laterality Date   . KNEE SURGERY Left 2019       Social History:    Social History     Tobacco Use   . Smoking status: Never     Passive exposure: Never   . Smokeless tobacco: Never   Substance Use Topics   . Alcohol use: Yes     Alcohol/week: 3.0 standard drinks     Types: 1 Glasses of wine, 1 Cans of beer, 1 Shots of liquor per week     Comment: weekly                                Counseling given: Not Answered      Vital Signs (Current):   Vitals:    09/14/21 0949   BP: (!) 140/86   Pulse: 76   Resp: 16   Temp: 97.2 F (36.2 C)   TempSrc: Temporal    SpO2: 100%   Weight: (!) 344 lb 12.8 oz (156.4 kg)   Height: 6\' 8"  (2.032 m)                                              BP Readings from Last 3 Encounters:   09/14/21 (!) 140/86       NPO Status: Time of last liquid consumption:  1930                        Time of last solid consumption: 2200                        Date of last liquid consumption: 09/13/21                        Date of last solid food consumption: 09/13/21    BMI:   Wt Readings from Last 3 Encounters:   09/14/21 (!) 344 lb 12.8 oz (156.4 kg)   09/02/21 (!) 325 lb (147.4 kg)     Body mass index is 37.88 kg/m.    CBC:   Lab Results   Component Value Date/Time    WBC 8.4 08/31/2021 02:35 PM    RBC 5.18 08/31/2021 02:35 PM    HGB 15.1 08/31/2021 02:35 PM    HCT 45.1 08/31/2021 02:35 PM    MCV 87.1 08/31/2021 02:35 PM    RDW 13.7 08/31/2021 02:35 PM    PLT 341 08/31/2021 02:35 PM       CMP:   Lab Results   Component Value Date/Time    NA 137 08/31/2021 02:35 PM    K 4.8 08/31/2021 02:35 PM    CL 105 08/31/2021 02:35 PM    CO2 28 08/31/2021 02:35 PM    BUN 19 08/31/2021 02:35 PM    CREATININE 1.25 08/31/2021 02:35 PM    LABGLOM >60 08/31/2021 02:35 PM    GLUCOSE 109 08/31/2021 02:35 PM    PROT 7.7 08/31/2021 02:35 PM    CALCIUM 9.6 08/31/2021 02:35 PM    BILITOT 0.7 08/31/2021 02:35 PM    ALKPHOS 119 08/31/2021 02:35 PM    AST 25 08/31/2021 02:35 PM    ALT 93 08/31/2021 02:35 PM       POC Tests: No results for input(s): POCGLU, POCNA, POCK, POCCL, POCBUN, POCHEMO, POCHCT in the last 72 hours.    Coags: No results found for: PROTIME, INR, APTT    HCG (If Applicable): No results found for: PREGTESTUR, PREGSERUM, HCG, HCGQUANT     ABGs: No results found for: PHART, PO2ART, PCO2ART, HCO3ART, BEART, O2SATART     Type & Screen (If Applicable):  No results found for: LABABO, LABRH    Drug/Infectious Status (If Applicable):  No results found for: HIV, HEPCAB    COVID-19 Screening (If Applicable): No results found for: COVID19        Anesthesia  Evaluation  Patient summary reviewed and Nursing notes reviewed no history of anesthetic complications:   Airway: Mallampati: II  TM distance: >3 FB   Neck ROM: full  Mouth opening: > = 3 FB   Dental:          Pulmonary:Negative Pulmonary ROS breath sounds clear to auscultation      (-) not a current smoker                           Cardiovascular:Negative CV ROS  Exercise tolerance: good (>4 METS),           Rhythm: regular  Rate: normal           Beta Blocker:  Not on Beta Blocker         Neuro/Psych:   Negative Neuro/Psych ROS  GI/Hepatic/Renal: Neg GI/Hepatic/Renal ROS       (-) hiatal hernia and GERD       Endo/Other: Negative Endo/Other ROS                    Abdominal:   (+) obese,           Vascular:          Other Findings:           Anesthesia Plan      general     ASA 2       Induction: intravenous.    MIPS: Postoperative opioids intended.  Anesthetic plan and risks discussed with patient.      Plan discussed with CRNA.          Post-op pain plan if not by surgeon: single peripheral nerve block            Brenton Grills, MD   09/14/2021

## 2021-09-14 NOTE — Other (Signed)
TRANSFER - IN REPORT:    Verbal report received from Purnell Shoemaker RN  on EDWARDS MCKELVIE  being received from PACU for routine post-op      Report consisted of patient's Situation, Background, Assessment and   Recommendations(SBAR).     Information from the following report(s) Nurse Handoff Report, Surgery Report, Intake/Output, and MAR was reviewed with the receiving nurse.    Opportunity for questions and clarification was provided.      Assessment completed upon patient's arrival to unit and care assumed.

## 2021-09-14 NOTE — Anesthesia Procedure Notes (Signed)
Peripheral Block    Patient location during procedure: pre-op  Reason for block: post-op pain management and at surgeon's request  Start time: 09/14/2021 11:23 AM  End time: 09/14/2021 11:28 AM  Staffing  Performed: anesthesiologist   Anesthesiologist: Berneta Levins, MD  Preanesthetic Checklist  Completed: patient identified, IV checked, site marked, risks and benefits discussed, surgical/procedural consents, equipment checked, pre-op evaluation, timeout performed, anesthesia consent given, oxygen available and monitors applied/VS acknowledged  Peripheral Block   Patient position: supine  Prep: ChloraPrep  Provider prep: mask and sterile gloves  Patient monitoring: cardiac monitor, continuous pulse ox, frequent blood pressure checks, IV access, oxygen and responsive to questions  Block type: Femoral  Femoral crease  Laterality: left  Injection technique: single-shot  Guidance: nerve stimulator and ultrasound guided    Needle   Needle type: insulated echogenic nerve stimulator needle   Needle gauge: 20 G  Needle localization: nerve stimulator and ultrasound guidance  Needle length: 10 cm  Assessment   Injection assessment: negative aspiration for heme, no paresthesia on injection, local visualized surrounding nerve on ultrasound and no intravascular symptoms  Paresthesia pain: none  Slow fractionated injection: yes  Hemodynamics: stable  Real-time [place] image taken/store: yes  Outcomes: uncomplicated and patient tolerated procedure well    Additional Notes  Minimal patellar snap at 0.4 mA  Medications Administered  dexamethasone (DECADRON) (PF) 10 mg/mL injection - Other   4 mg - 09/14/2021 11:28:00 AM  dexmedetomidine (PRECEDEX) injection 200 mcg/2 mL - Perineural   0.2 mL - 09/14/2021 11:28:00 AM  ropivacaine (NAROPIN) injection 0.5% - Perineural   30 mL - 09/14/2021 11:28:00 AM

## 2021-09-14 NOTE — Other (Signed)
Patient assisted to the restroom and back to stretcher without incident.  Call bell within reach and no further needs at this time.

## 2021-09-14 NOTE — Other (Signed)
TRANSFER - OUT REPORT:    Verbal report given to Raelyn Ensign, RN on Shawn Greer  being transferred to Phase  for routine post-op       Report consisted of patient's Situation, Background, Assessment and   Recommendations(SBAR).     Information from the following report(s) Nurse Handoff Report was reviewed with the receiving nurse.           Lines:   Peripheral IV 09/14/21 Left;Posterior Hand (Active)   Site Assessment Clean, dry & intact 09/14/21 1531   Line Status Infusing 09/14/21 1531   Line Care Connections checked and tightened 09/14/21 1531   Phlebitis Assessment No symptoms 09/14/21 1531   Infiltration Assessment 0 09/14/21 1531   Dressing Status Clean, dry & intact 09/14/21 1531   Dressing Type Transparent 09/14/21 1531        Opportunity for questions and clarification was provided.      Patient transported with:  Registered Nurse

## 2021-09-14 NOTE — Other (Signed)
TRANSFER - IN REPORT:    Verbal report received from ORNn & CRNA on HERMES WAFER  being received from OR  for routine post-op      Report consisted of patient's Situation, Background, Assessment and   Recommendations(SBAR).     Information from the following report(s) Nurse Handoff Report was reviewed with the receiving nurse.    Opportunity for questions and clarification was provided.      Assessment completed upon patient's arrival to unit and care assumed.

## 2021-09-14 NOTE — Other (Signed)
Reviewed PTA medication list with patient/caregiver and patient/caregiver denies any additional medications.     Patient admits to having a responsible adult care for them at home for at least 24 hours after surgery.    Patient encouraged to use gown warming system and informed that using said warming gown to regulate body temperature prior to a procedure has been shown to help reduce the risks of blood clots and infection.    Patient's pharmacy of choice verified and documented in PTA medication section.    Dual skin assessment & fall risk band verification completed with Jeanette M RN.
